# Patient Record
Sex: Male | Born: 1965 | Race: Black or African American | Hispanic: No | Marital: Single | Smoking: Current every day smoker
Health system: Southern US, Community
[De-identification: ages and names within clinical notes are randomized; demographics above are authoritative.]

## PROBLEM LIST (undated history)

## (undated) DIAGNOSIS — W3400XA Accidental discharge from unspecified firearms or gun, initial encounter: Secondary | ICD-10-CM

## (undated) HISTORY — PX: HERNIA REPAIR: SHX51

## (undated) HISTORY — PX: NECK SURGERY: SHX720

---

## 2001-03-12 ENCOUNTER — Emergency Department (HOSPITAL_COMMUNITY): Admission: EM | Admit: 2001-03-12 | Discharge: 2001-03-12 | Payer: Self-pay

## 2001-10-03 ENCOUNTER — Encounter: Payer: Self-pay | Admitting: Emergency Medicine

## 2001-10-03 ENCOUNTER — Emergency Department (HOSPITAL_COMMUNITY): Admission: EM | Admit: 2001-10-03 | Discharge: 2001-10-03 | Payer: Self-pay | Admitting: Emergency Medicine

## 2002-08-29 ENCOUNTER — Encounter: Payer: Self-pay | Admitting: Emergency Medicine

## 2002-08-29 ENCOUNTER — Emergency Department (HOSPITAL_COMMUNITY): Admission: EM | Admit: 2002-08-29 | Discharge: 2002-08-29 | Payer: Self-pay | Admitting: Emergency Medicine

## 2003-04-21 ENCOUNTER — Emergency Department (HOSPITAL_COMMUNITY): Admission: AD | Admit: 2003-04-21 | Discharge: 2003-04-21 | Payer: Self-pay | Admitting: Emergency Medicine

## 2003-04-23 ENCOUNTER — Emergency Department (HOSPITAL_COMMUNITY): Admission: EM | Admit: 2003-04-23 | Discharge: 2003-04-23 | Payer: Self-pay | Admitting: Emergency Medicine

## 2003-05-02 ENCOUNTER — Emergency Department (HOSPITAL_COMMUNITY): Admission: EM | Admit: 2003-05-02 | Discharge: 2003-05-02 | Payer: Self-pay | Admitting: Emergency Medicine

## 2007-06-10 ENCOUNTER — Inpatient Hospital Stay (HOSPITAL_COMMUNITY): Admission: EM | Admit: 2007-06-10 | Discharge: 2007-06-12 | Payer: Self-pay | Admitting: Emergency Medicine

## 2010-06-01 NOTE — Op Note (Signed)
[name], [name] NO.:  [id number]   MEDICAL RECORD NO.:  [id number]          PATIENT TYPE:  INP   LOCATION:  3111                         FACILITY:  [facility]   PHYSICIAN:  Danae Orleans. Venetia Maxon, M.D.  DATE OF BIRTH:  05-17-1965   DATE OF PROCEDURE:  06/11/2007  DATE OF DISCHARGE:                               OPERATIVE REPORT   PREOPERATIVE DIAGNOSIS:  Fracture subluxation of C5 and C6.   POSTOPERATIVE DIAGNOSIS:  Fracture subluxation of C5 and C6.   PROCEDURE:  Open reduction and internal fixation, C5-6 fracture, with  anterior cervical decompression and anterior cervical grafting with VG2  bone allograft wedge with anterior cervical plating.   SURGEON:  Danae Orleans. Venetia Maxon, MD   ANESTHESIA:  General endotracheal anesthesia.   ESTIMATED BLOOD LOSS:  Minimal.   COMPLICATIONS:  None.   DISPOSITION:  Recovery.   INDICATIONS:  Gregory Rosales is a 45 year old man with fracture subluxation of  C5 and C6 with bilateral hand numbness and initially some left-sided  body and arm pain and weakness.  It was elected to take him to surgery  for open reduction and internal fixation of C5-6 fracture with anterior  cervical decompression and fusion at this level.   PROCEDURE IN DETAIL:  Mr. Gregory Rosales was brought to the operating room.  He was  placed in the supine position on the operating table after smooth and  satisfactory uncomplicated induction of general endotracheal anesthesia  using a GlideScope put by anesthesia.  His anterior neck was then  maintained in neutral alignment.  The shoulders were taped, and initial  x-ray demonstrated well-aligned cervical spine with reduction of  fracture subluxation.  Subsequently, the anterior neck was then prepped  and draped in usual sterile fashion using 1% local lidocaine with  epinephrine.  An incision was marked on the left side of midline from  the anterior border of the sternocleidomastoid muscle toward the midline  and was carried  through platysmal layer.  Subplatysmal dissection was  performed exposing the anterior border of the sternocleidomastoid muscle  keeping the carotid sheath lateral, trachea, and esophagus kept medial.  The anterior cervical spine was identified.  The anterior longitudinal  ligament was bloody and boggy consistent with injury.  A bent spinal  needle was placed and was felt to be at C5-6 level, and this was  confirmed by an intraoperative x-ray.  Longus colli muscles were taken  down from C5-C6 bilaterally using electrocautery and Key elevator.  Self-  retaining shadow line retractor was placed to facilitate the exposure.  The interspace was incised.  The disk was disrupted particularly on the  left side of midline.  It was removed virtually in entirety.  The  posterior longitudinal ligament had been disrupted, and the spinal cord  dura was identified.  Some bone fragments were removed with 2-mm gold  Kerrison rongeur.  The endplates were decorticated with high-speed  drill.  Distraction pins were placed.  This level was highly disrupted  and unstable.  After decorticating the endplates and trial sizing a 5 x  7-mm VG2  bone graft was selected, rehydrated, tamped into position, and  countersunk appropriately.  A 14-mm Trestle anterior cervical plate was  then fixed to the anterior cervical spine using fixed angle 14-mm  screws; 2 at C5, 2 at C6.  All screws had excellent purchase.  Their  position was confirmed on lateral x-ray.  The 5 pounds of traction  weight was removed prior to placing the plate.  Hemostasis was then  assured, and the wound was irrigated, and platysmal layer was  reapproximated with 3-0 Vicryl sutures, and skin edges were approximated  with 2-0 Vicryl interrupted stitches.  Wound was dressed with Dermabond.  The patient was extubated in the operating room and taken to recovery in  stable and satisfactory condition.  He tolerated the operative procedure  well.  Counts  were correct at the end of the case.      Danae Orleans. Venetia Maxon, M.D.  Electronically Signed     JDS/MEDQ  D:  06/11/2007  T:  06/11/2007  Job:  [id number]
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
## 2010-10-13 LAB — POCT I-STAT, CHEM 8
BUN: 13
Calcium, Ion: 1.12
Chloride: 106
Glucose, Bld: 74
Potassium: 3.8

## 2010-10-13 LAB — LACTIC ACID, PLASMA: Lactic Acid, Venous: 0.9

## 2010-10-13 LAB — URINALYSIS, ROUTINE W REFLEX MICROSCOPIC
Glucose, UA: NEGATIVE
Specific Gravity, Urine: 1.012
pH: 7

## 2010-10-13 LAB — CBC
HCT: 43.4
HCT: 47.5
Hemoglobin: 16.3
MCHC: 35.2
MCV: 91.7
RBC: 4.73
WBC: 12.9 — ABNORMAL HIGH

## 2010-10-13 LAB — BASIC METABOLIC PANEL
CO2: 26
Chloride: 107
Creatinine, Ser: 1.2
GFR calc Af Amer: >60
Potassium: 4.5

## 2010-10-13 LAB — DIFFERENTIAL
Eosinophils Relative: 1
Lymphocytes Relative: 9 — ABNORMAL LOW
Lymphs Abs: 1.2
Monocytes Absolute: 0.8

## 2017-02-27 ENCOUNTER — Emergency Department (HOSPITAL_COMMUNITY)
Admission: EM | Admit: 2017-02-27 | Discharge: 2017-02-27 | Disposition: A | Discharge status: Home / Self Care | Payer: Self-pay | Attending: Emergency Medicine | Admitting: Emergency Medicine

## 2017-02-27 ENCOUNTER — Emergency Department (HOSPITAL_COMMUNITY): Payer: Self-pay

## 2017-02-27 ENCOUNTER — Other Ambulatory Visit: Payer: Self-pay

## 2017-02-27 ENCOUNTER — Encounter (HOSPITAL_COMMUNITY): Payer: Self-pay

## 2017-02-27 DIAGNOSIS — F17210 Nicotine dependence, cigarettes, uncomplicated: Secondary | ICD-10-CM | POA: Insufficient documentation

## 2017-02-27 DIAGNOSIS — M545 Low back pain, unspecified: Secondary | ICD-10-CM

## 2017-02-27 DIAGNOSIS — R1032 Left lower quadrant pain: Secondary | ICD-10-CM | POA: Insufficient documentation

## 2017-02-27 DIAGNOSIS — Z79899 Other long term (current) drug therapy: Secondary | ICD-10-CM | POA: Insufficient documentation

## 2017-02-27 HISTORY — DX: Accidental discharge from unspecified firearms or gun, initial encounter: W34.00XA

## 2017-02-27 LAB — CBC WITH DIFFERENTIAL/PLATELET
BASOS ABS: 0 10*3/uL (ref 0.0–0.1)
BASOS PCT: 1 %
EOS PCT: 14 %
Eosinophils Absolute: 0.7 10*3/uL (ref 0.0–0.7)
HCT: 36.5 % — ABNORMAL LOW (ref 39.0–52.0)
Hemoglobin: 12.8 g/dL — ABNORMAL LOW (ref 13.0–17.0)
LYMPHS PCT: 44 %
Lymphs Abs: 2.4 10*3/uL (ref 0.7–4.0)
MCH: 32.5 pg (ref 26.0–34.0)
MCHC: 35.1 g/dL (ref 30.0–36.0)
MCV: 92.6 fL (ref 78.0–100.0)
Monocytes Absolute: 0.3 10*3/uL (ref 0.1–1.0)
Monocytes Relative: 5 %
Neutro Abs: 1.9 10*3/uL (ref 1.7–7.7)
Neutrophils Relative %: 36 %
PLATELETS: 249 10*3/uL (ref 150–400)
RBC: 3.94 MIL/uL — ABNORMAL LOW (ref 4.22–5.81)
RDW: 12.4 % (ref 11.5–15.5)
WBC: 5.4 10*3/uL (ref 4.0–10.5)

## 2017-02-27 LAB — URINALYSIS, ROUTINE W REFLEX MICROSCOPIC
BACTERIA UA: NONE SEEN
Bilirubin Urine: NEGATIVE
Glucose, UA: NEGATIVE mg/dL
Hgb urine dipstick: NEGATIVE
KETONES UR: NEGATIVE mg/dL
Nitrite: NEGATIVE
Protein, ur: NEGATIVE mg/dL
Specific Gravity, Urine: 1.011 (ref 1.005–1.030)
pH: 6 (ref 5.0–8.0)

## 2017-02-27 LAB — I-STAT CHEM 8, ED
BUN: 11 mg/dL (ref 6–20)
Calcium, Ion: 1.2 mmol/L (ref 1.15–1.40)
Chloride: 103 mmol/L (ref 101–111)
Creatinine, Ser: 0.9 mg/dL (ref 0.61–1.24)
Glucose, Bld: 100 mg/dL — ABNORMAL HIGH (ref 65–99)
HCT: 41 % (ref 39.0–52.0)
HEMOGLOBIN: 13.9 g/dL (ref 13.0–17.0)
POTASSIUM: 4.1 mmol/L (ref 3.5–5.1)
Sodium: 141 mmol/L (ref 135–145)
TCO2: 28 mmol/L (ref 22–32)

## 2017-02-27 MED ORDER — METHOCARBAMOL 500 MG PO TABS: 1000.0000 mg | ORAL_TABLET | Freq: Three times a day (TID) | ORAL | 0 refills | Status: AC | PRN

## 2017-02-27 MED ORDER — IOPAMIDOL (ISOVUE-300) INJECTION 61%
100.0000 mL | Freq: Once | INTRAVENOUS | Status: AC | PRN
  Administered 2017-02-27: 100 mL via INTRAVENOUS

## 2017-02-27 MED ORDER — NAPROXEN 500 MG PO TABS: 500.0000 mg | ORAL_TABLET | Freq: Two times a day (BID) | ORAL | 0 refills | Status: AC

## 2017-02-27 MED ORDER — IOPAMIDOL (ISOVUE-300) INJECTION 61%
INTRAVENOUS | Status: AC
  Filled 2017-02-27: qty 100

## 2017-02-27 MED ORDER — METHYLPREDNISOLONE 4 MG PO TBPK: ORAL_TABLET | ORAL | 0 refills | Status: AC

## 2017-02-27 MED ORDER — MORPHINE SULFATE (PF) 4 MG/ML IV SOLN
4.0000 mg | Freq: Once | INTRAVENOUS | Status: AC
  Administered 2017-02-27: 4 mg via INTRAVENOUS
  Filled 2017-02-27: qty 1

## 2017-02-27 NOTE — ED Notes (Signed)
Patient transported to CT 

## 2017-02-27 NOTE — ED Triage Notes (Signed)
Patient c/o left lower back x 3 weeks. Patient states when he takes a deep breath he has increased pain.Patient denies any leg swelling or pain. Patient c/o left abdominal pain
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
## 2019-06-14 IMAGING — CT CT ABD-PELV W/ CM
2 of 5 series · 15 of 46 positions shown, 17 images · IV contrast (ISOVUE)
Comparison: CT chest abdomen and pelvis 06/10/2007.

CLINICAL DATA: 51-year-old male with left abdominal, left lower
back pain for 3 weeks. Pleuritic pain. Prior hernia repair.

EXAM:
CT ABDOMEN AND PELVIS WITH CONTRAST
TECHNIQUE: Multidetector CT imaging of the abdomen and pelvis was performed
using the standard protocol following bolus administration of
intravenous contrast.
CONTRAST:  100mL H1CFYO-A66 IOPAMIDOL (H1CFYO-A66) INJECTION 61%

[Series 2: axial st · axial · 0.63mm/px · z∈[-690,-310]mm · 12 of 88 slices shown, 14 images]
[im 6/88  soft-tissue]
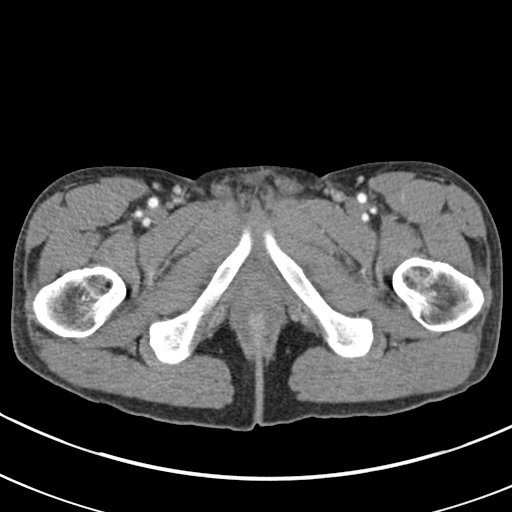
[im 6/88  bone]
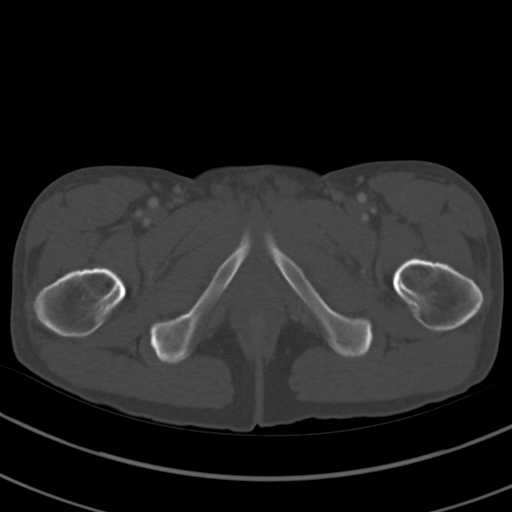
[im 11/88  soft-tissue]
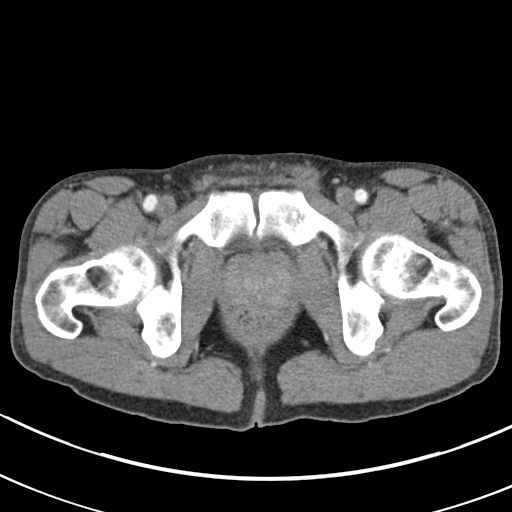
[im 22/88  soft-tissue]
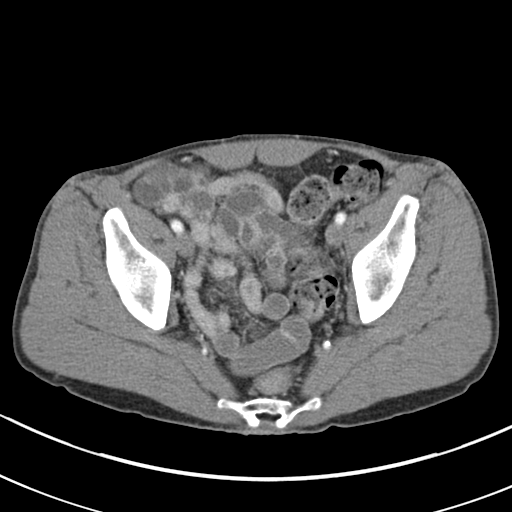
[im 28/88  soft-tissue]
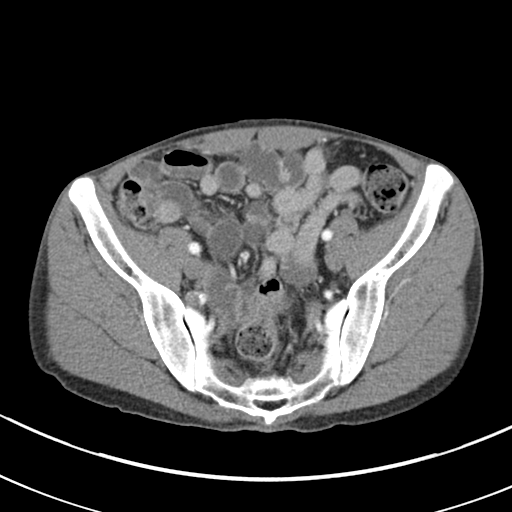
[im 33/88  soft-tissue]
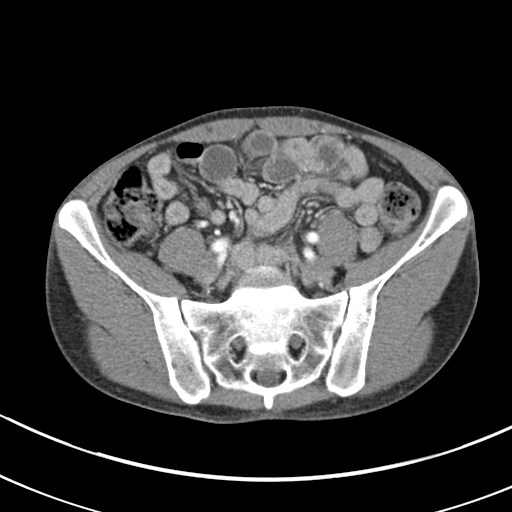
[im 39/88  soft-tissue]
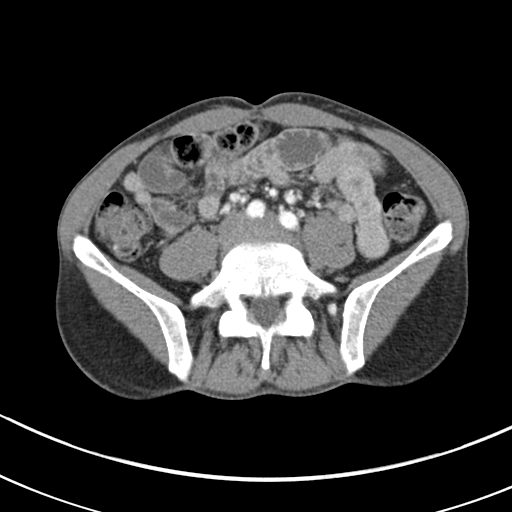
[im 49/88  soft-tissue]
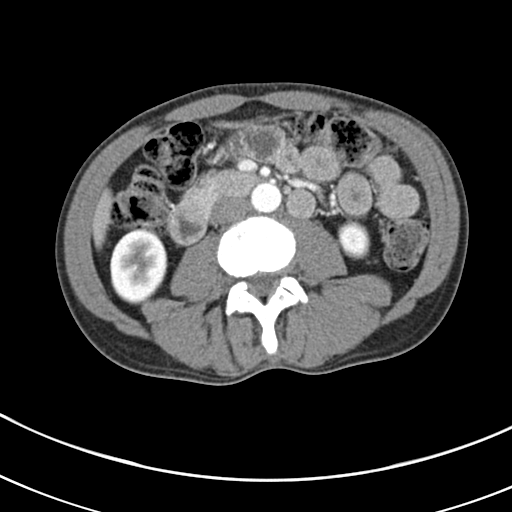
[im 55/88  soft-tissue]
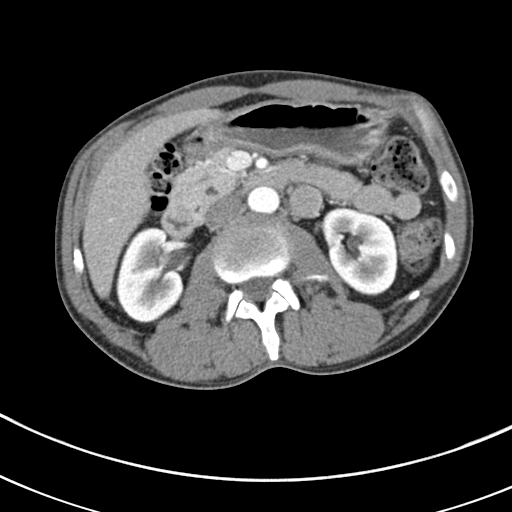
[im 60/88  soft-tissue]
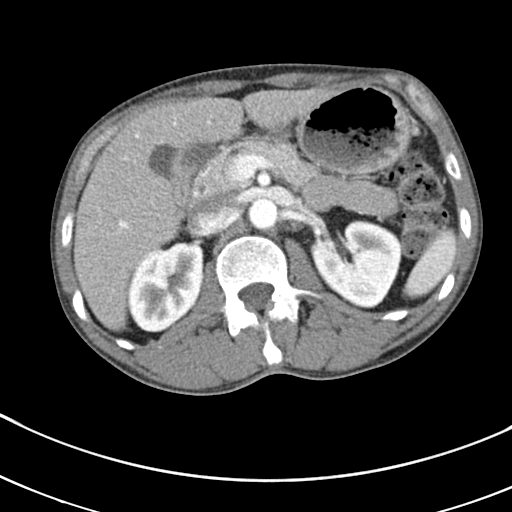
[im 60/88  bone]
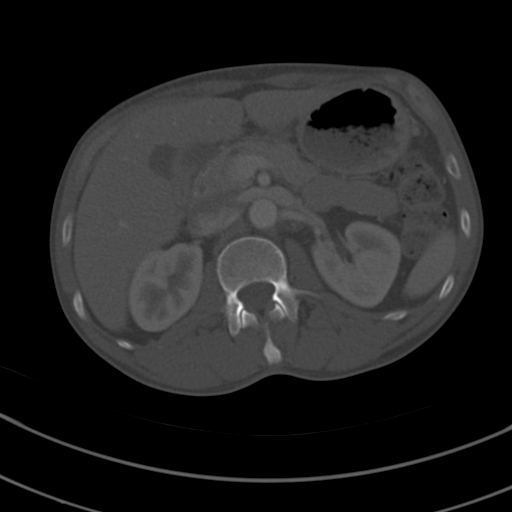
[im 66/88  soft-tissue]
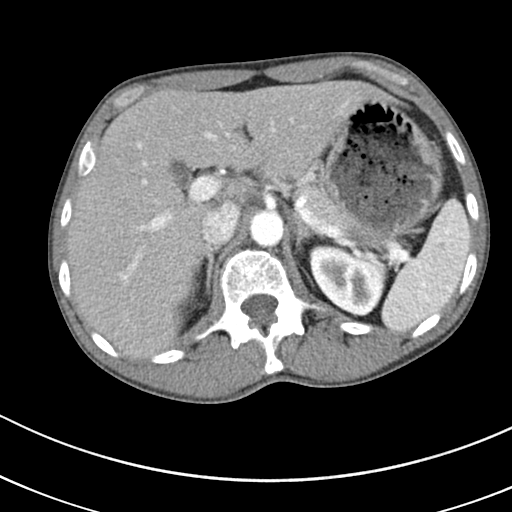
[im 77/88  soft-tissue]
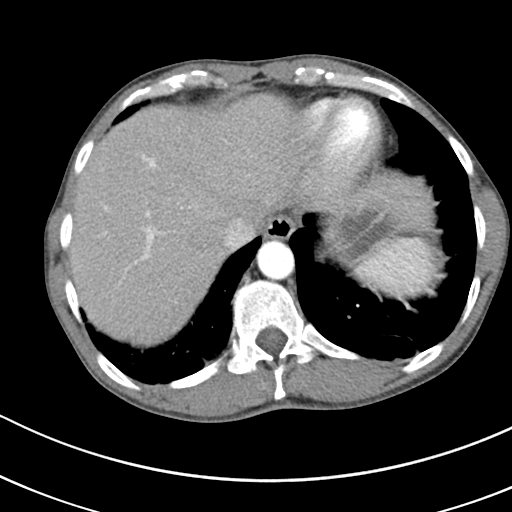
[im 82/88  soft-tissue]
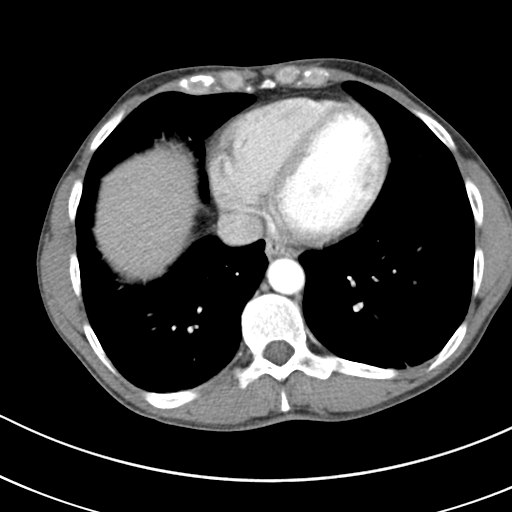

[Series 5: coronal st · coronal · 0.63mm/px · 3 of 83 slices shown]
[im 28/83  soft-tissue]
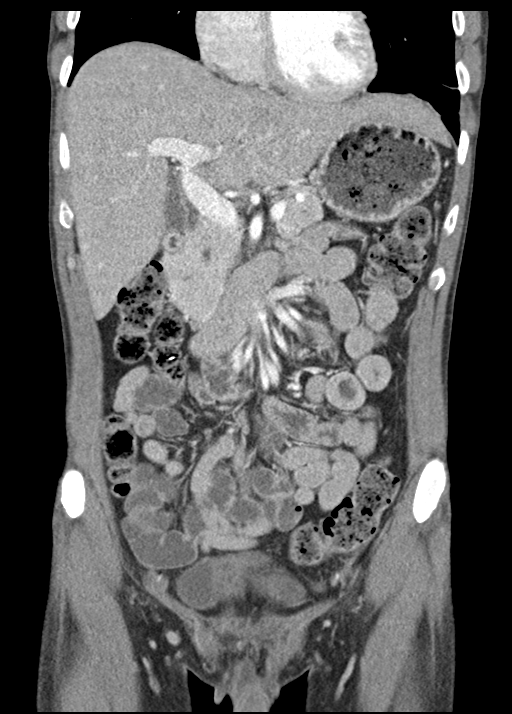
[im 37/83  soft-tissue]
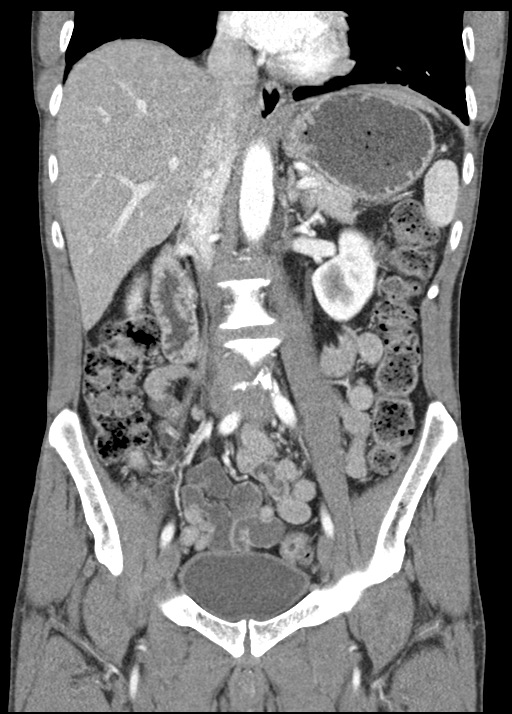
[im 46/83  soft-tissue]
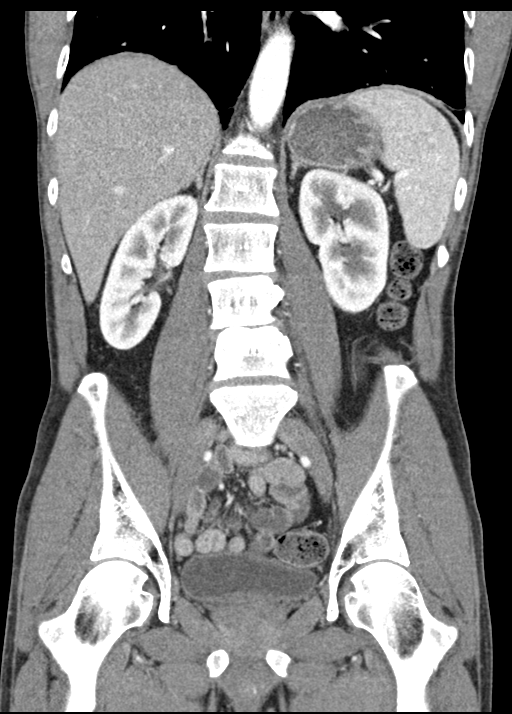

[15 of 46 positions shown; findings below may reference images not displayed]

FINDINGS: Lower chest: Mild bilateral lung base streaky and confluent opacity
similar to the 8995 chest CT. No other lower pulmonary opacity. No
pericardial or pleural effusion.

Hepatobiliary: Negative liver and gallbladder.

Pancreas: Upper limits of normal to mildly dilated central
pancreatic duct (series 2, image 36), but unchanged since the 8995
comparison. Pancreatic parenchymal enhancement appears normal. No
pancreatic inflammation.

Spleen: Negative.

Adrenals/Urinary Tract: Normal adrenal glands.

Bilateral renal enhancement and contrast excretion is symmetric and
normal. No perinephric stranding. No nephrolithiasis. Negative
course of the left ureter.

Unremarkable urinary bladder. Small pelvic phleboliths, more
numerous on the right, are mildly increased in number since 8995.

Stomach/Bowel: Negative rectum. Negative sigmoid colon and
descending colon aside from retained stool. Similar retained stool
in the transverse colon and right colon. No pericecal inflammation.
The appendix is not identified and could be diminutive or absent.

Intermittent fluid-filled but nondilated small bowel loops
throughout the abdomen and pelvis. No abnormal wall thickening or
mesenteric inflammation identified. Negative stomach and duodenum.

No free air or abdominal free fluid.

Vascular/Lymphatic: Major arterial structures in the abdomen and
pelvis are patent. Mild iliac artery calcified atherosclerosis.
Portal venous system appears patent.

No lymphadenopathy.

Reproductive: Negative.

Other: No pelvic free fluid.

Musculoskeletal: Stable visualized osseous structures.
IMPRESSION: 1. No acute or inflammatory process identified to explain abdominal
pain.
2. Mild dilatation of the main pancreatic duct is unchanged since
8995 and appears inconsequential.
3. Mild lung base atelectasis and/or scarring is similar to the 8[address]
chest CT.

## 2021-03-13 ENCOUNTER — Emergency Department (HOSPITAL_COMMUNITY)
Admission: EM | Admit: 2021-03-13 | Discharge: 2021-03-13 | Disposition: A | Discharge status: Home / Self Care | Payer: Self-pay | Attending: Emergency Medicine | Admitting: Emergency Medicine

## 2021-03-13 ENCOUNTER — Emergency Department (HOSPITAL_COMMUNITY): Payer: Self-pay

## 2021-03-13 DIAGNOSIS — Z23 Encounter for immunization: Secondary | ICD-10-CM | POA: Insufficient documentation

## 2021-03-13 DIAGNOSIS — Y990 Civilian activity done for income or pay: Secondary | ICD-10-CM | POA: Insufficient documentation

## 2021-03-13 DIAGNOSIS — S61313A Laceration without foreign body of left middle finger with damage to nail, initial encounter: Secondary | ICD-10-CM

## 2021-03-13 DIAGNOSIS — S61213A Laceration without foreign body of left middle finger without damage to nail, initial encounter: Secondary | ICD-10-CM | POA: Insufficient documentation

## 2021-03-13 DIAGNOSIS — W230XXA Caught, crushed, jammed, or pinched between moving objects, initial encounter: Secondary | ICD-10-CM | POA: Insufficient documentation

## 2021-03-13 MED ORDER — TETANUS-DIPHTH-ACELL PERTUSSIS 5-2.5-18.5 LF-MCG/0.5 IM SUSY
0.5000 mL | PREFILLED_SYRINGE | Freq: Once | INTRAMUSCULAR | Status: AC
  Administered 2021-03-13: 0.5 mL via INTRAMUSCULAR
  Filled 2021-03-13: qty 0.5

## 2021-03-13 MED ORDER — ONDANSETRON HCL 4 MG/2ML IJ SOLN
4.0000 mg | Freq: Once | INTRAMUSCULAR | Status: AC
Start: 2021-03-13 — End: 2021-03-13
  Administered 2021-03-13: 4 mg via INTRAVENOUS
  Filled 2021-03-13: qty 2

## 2021-03-13 MED ORDER — OXYCODONE-ACETAMINOPHEN 5-325 MG PO TABS: 1.0000 | ORAL_TABLET | Freq: Four times a day (QID) | ORAL | 0 refills | Status: AC | PRN

## 2021-03-13 MED ORDER — MORPHINE SULFATE (PF) 4 MG/ML IV SOLN
4.0000 mg | Freq: Once | INTRAVENOUS | Status: AC
  Administered 2021-03-13: 4 mg via INTRAVENOUS
  Filled 2021-03-13: qty 1

## 2021-03-13 MED ORDER — LIDOCAINE HCL (PF) 1 % IJ SOLN
5.0000 mL | Freq: Once | INTRAMUSCULAR | Status: AC
  Administered 2021-03-13: 5 mL
  Filled 2021-03-13: qty 30

## 2021-03-13 MED ORDER — CEPHALEXIN 500 MG PO CAPS: 500.0000 mg | ORAL_CAPSULE | Freq: Four times a day (QID) | ORAL | 0 refills | Status: AC

## 2021-03-13 NOTE — ED Triage Notes (Addendum)
Patient reports his finger got jammed between a dolly and work truck at 245 am today. Patient states "I think I have about severed my finger". Pain 10/10, left middle finger

## 2021-03-13 NOTE — Discharge Instructions (Addendum)
Keep your finger wrapped until you follow-up with Dr. Izora Ribas on Monday.  If the bandages do become saturated you can change the bandage but otherwise just leave it be.  Start taking the antibiotic.  Elevate your finger.  You can use ibuprofen but you were given a prescription for stronger pain medication if you need it.

## 2021-03-13 NOTE — ED Provider Notes (Signed)
[facility] DEPT Provider Note   CSN: [id number] Arrival date & time: 03/13/21  1007     History  Chief Complaint  Patient presents with   Laceration    Gregory Rosales is a 56 y.o. male.  Patient is a 56 year old male with no significant past medical history who works for [employer] and is presenting today after he sustained an injury to his left middle finger.  He was working overnight on the truck and his finger became caught between the truck and the [employer].  He was able to get the rest of his hand out but his finger was smashed.  This happened around 245 this morning and he was in [place] so someone drove him back here.  He is having significant pain in the left middle finger.  He denies injury anywhere else.  Since that he has had intermittent episodes of feeling lightheaded and nauseated but was having no issues prior to the injury.  Unknown when his last tetanus shot was.  The history is provided by the patient.  Laceration Location:  Hand Hand laceration location:  L fingers     Home Medications Prior to Admission medications   Medication Sig Start Date End Date Taking? Authorizing Provider  cephALEXin (KEFLEX) 500 MG capsule Take 1 capsule (500 mg total) by mouth 4 (four) times daily. 03/13/21  Yes Blanchie Dessert, MD  oxyCODONE-acetaminophen (PERCOCET/ROXICET) 5-325 MG tablet Take 1 tablet by mouth every 6 (six) hours as needed for severe pain. 03/13/21  Yes Blanchie Dessert, MD  acetaminophen (TYLENOL) 500 MG tablet Take 2,000 mg by mouth 2 (two) times daily as needed.    [provider]  cyclobenzaprine (FLEXERIL) 10 MG tablet Take 10 mg by mouth 3 (three) times daily as needed for muscle spasms. EVERY 8 TO 12 HOURS    [provider]  ibuprofen (ADVIL,MOTRIN) 200 MG tablet Take 800 mg by mouth daily as needed for moderate pain.    [provider]  Liniments (SALONPAS PAIN RELIEF PATCH EX) Apply 1 patch topically daily as  needed (PAIN).    [provider]  methocarbamol (ROBAXIN) 500 MG tablet Take 2 tablets (1,000 mg total) by mouth every 8 (eight) hours as needed for muscle spasms. 02/27/17   Quintella Reichert, MD  methylPREDNISolone (MEDROL DOSEPAK) 4 MG TBPK tablet Take according to label instructions 02/27/17   Quintella Reichert, MD  Multiple Vitamin (MULTIVITAMIN WITH MINERALS) TABS tablet Take 1 tablet by mouth daily.    [provider]  nabumetone (RELAFEN) 500 MG tablet Take 1,000 mg by mouth daily.    [provider]  naproxen (NAPROSYN) 500 MG tablet Take 1 tablet (500 mg total) by mouth 2 (two) times daily with a meal. 02/27/17   Quintella Reichert, MD      Allergies    Patient has no known allergies.    Review of Systems   Review of Systems  Physical Exam Updated Vital Signs BP (!) 130/92 (BP Location: Left Arm)    Pulse 84    Temp 98.6 F (37 C) (Oral)    Ht 5\' 11"  (1.803 m)    Wt 64 kg    SpO2 100%    BMI 19.67 kg/m  Physical Exam Vitals and nursing note reviewed.  Constitutional:      Appearance: Normal appearance. He is normal weight.  Eyes:     Pupils: Pupils are equal, round, and reactive to light.  Cardiovascular:     Rate and Rhythm:  Normal rate.  Pulmonary:     Effort: Pulmonary effort is normal.  Musculoskeletal:        General: Signs of injury present.       Hands:  Skin:    General: Skin is warm and dry.  Neurological:     Mental Status: He is alert. Mental status is at baseline.  Psychiatric:        Mood and Affect: Mood normal.        Behavior: Behavior normal.       ED Results / Procedures / Treatments   Labs (all labs ordered are listed, but only abnormal results are displayed) Labs Reviewed - No data to display  EKG None  Radiology DG Finger Middle Left  Result Date: 03/13/2021 CLINICAL DATA:  Crush injury to tip of left third finger. EXAM: LEFT MIDDLE FINGER 2+V COMPARISON:  None. FINDINGS: Soft tissue injury to the tip of the left  finger is evident with comminuted fracture involving the tuft of the distal phalanx. This is likely an open fracture. There is no evidence of additional soft tissue foreign body. No dislocation. IMPRESSION: Soft tissue injury with comminuted fracture involving the tuft of the left third distal phalanx. This is likely an open fracture based on x-ray appearance. Electronically Signed   By: Aletta Edouard M.D.   On: 03/13/2021 11:33    Procedures Procedures   LACERATION REPAIR Performed by: [employer] Authorized by: Blanchie Dessert Consent: Verbal consent obtained. Risks and benefits: risks, benefits and alternatives were discussed Consent given by: patient Patient identity confirmed: provided demographic data Prepped and Draped in normal sterile fashion Wound explored  Laceration Location: left middle finger  Laceration Length: 4cm  No Foreign Bodies seen or palpated  Anesthesia: digital block  Local anesthetic: lidocaine 1% without epinephrine  Anesthetic total: 5 ml  Irrigation method: syringe Amount of cleaning: standard  Skin closure: 6.0 prolene  Number of sutures: 5  Technique: Simple interrupted to bring together macerated areas of skin and underlying subcutaneous tissue to improve bleeding.  Nail was removed and replaced underneath the nailbed and sewn in place  Patient tolerance: Patient tolerated the procedure well with no immediate complications.    Medications Ordered in ED Medications  lidocaine (PF) (XYLOCAINE) 1 % injection 5 mL (has no administration in time range)  ondansetron (ZOFRAN) injection 4 mg (has no administration in time range)  Tdap (BOOSTRIX) injection 0.5 mL (has no administration in time range)    ED Course/ Medical Decision Making/ A&P                           Medical Decision Making Amount and/or Complexity of Data Reviewed Radiology: ordered.  Risk Prescription drug management.   Patient presenting after an injury at  230 this morning at work.  Patient had a crush injury to the left middle finger with nail involvement appears the nail is avulsed but also macerated skin without bone exposure but based on my independent view and interpretation of his x-ray he has a distal tuft fracture.  His tetanus shot was updated.  Pictures are in the chart and will discuss with hand surgery.  12:39 PM Wound was repaired as best as possible but skin was very macerated and some had to be cut off.  The nail was removed and then replaced underneath the nailbed.  Some subcutaneous tissue still attached to the back of the nail from the injury.  Patient was placed  on Keflex and given pain control.  Spoke with Dr. Lenon Curt with hand surgery and he recommended wrapping and Xeroform after repair and follow-up in the clinic on Monday.  All this information was given to the patient and his spouse.  He is stable for discharge at this time and does not need admission or further intervention at this moment.        Final Clinical Impression(s) / ED Diagnoses Final diagnoses:  Laceration of left middle finger without foreign body with damage to nail, initial encounter    Rx / DC Orders ED Discharge Orders          Ordered    cephALEXin (KEFLEX) 500 MG capsule  4 times daily        03/13/21 1231    oxyCODONE-acetaminophen (PERCOCET/ROXICET) 5-325 MG tablet  Every 6 hours PRN        03/13/21 1231              Blanchie Dessert, MD 03/13/21 1239

## 2023-06-28 IMAGING — DX DG FINGER MIDDLE 2+V*L*
3 series · 3 of 3 positions shown · non-contrast
Comparison: None.

CLINICAL DATA: Crush injury to tip of left third finger.

EXAM:
LEFT MIDDLE FINGER 2+V

[finger ap]
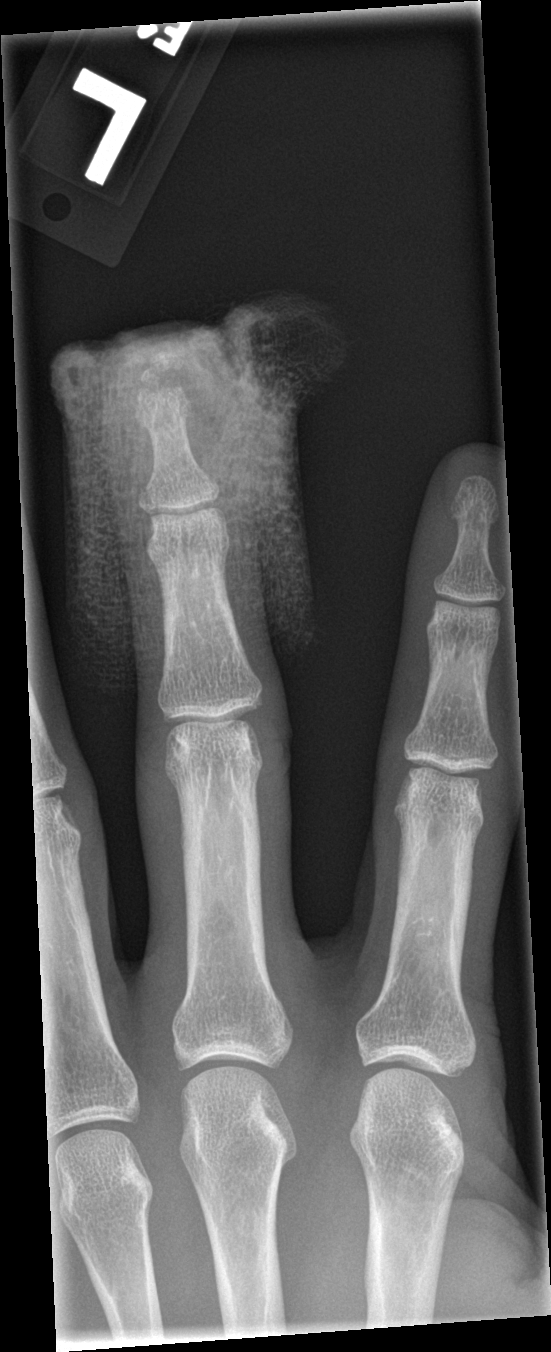

[finger obl]
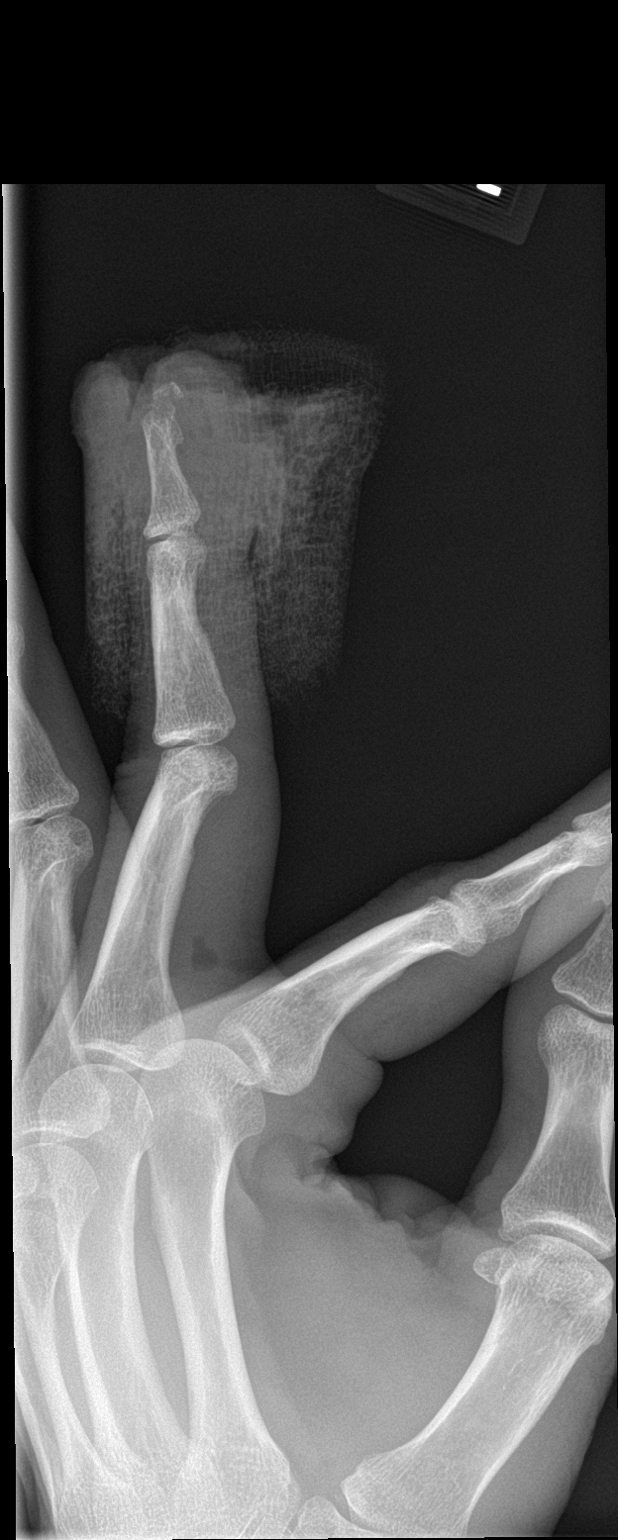

[finger lat]
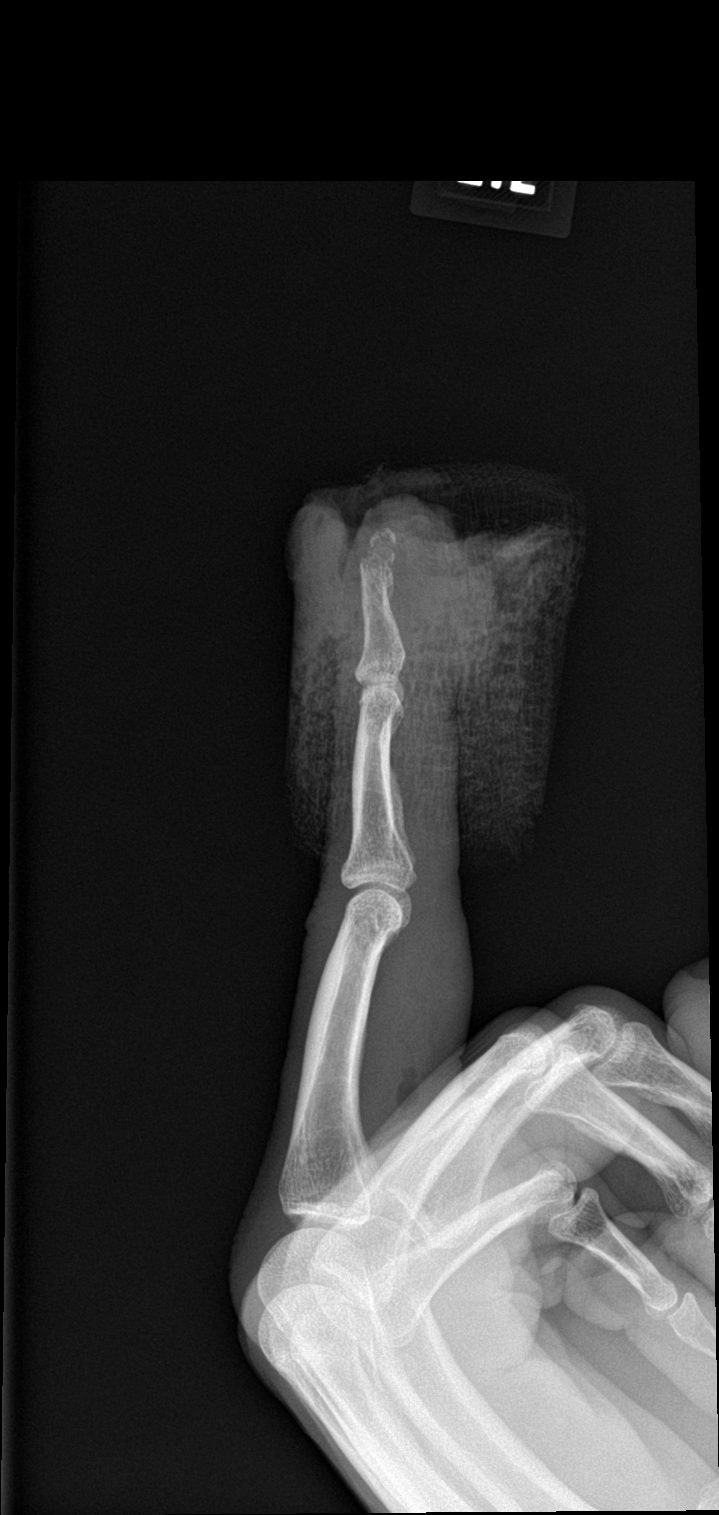

[3 of 3 positions shown; findings below may reference images not displayed]

FINDINGS: Soft tissue injury to the tip of the left finger is evident with
comminuted fracture involving the tuft of the distal phalanx. This
is likely an open fracture. There is no evidence of additional soft
tissue foreign body. No dislocation.
IMPRESSION: Soft tissue injury with comminuted fracture involving the tuft of
the left third distal phalanx. This is likely an open fracture based
on x-ray appearance.
